# Patient Record
Sex: Female | Born: 1950 | Race: White | Hispanic: No | Marital: Married | Smoking: Never smoker
Health system: Southern US, Community
[De-identification: ages and names within clinical notes are randomized; demographics above are authoritative.]

## PROBLEM LIST (undated history)

## (undated) DIAGNOSIS — K589 Irritable bowel syndrome without diarrhea: Secondary | ICD-10-CM

## (undated) DIAGNOSIS — M722 Plantar fascial fibromatosis: Secondary | ICD-10-CM

## (undated) DIAGNOSIS — C801 Malignant (primary) neoplasm, unspecified: Secondary | ICD-10-CM

## (undated) DIAGNOSIS — R7303 Prediabetes: Secondary | ICD-10-CM

## (undated) DIAGNOSIS — Z8742 Personal history of other diseases of the female genital tract: Secondary | ICD-10-CM

## (undated) HISTORY — PX: NASAL SINUS SURGERY: SHX719

## (undated) HISTORY — PX: TOTAL ABDOMINAL HYSTERECTOMY W/ BILATERAL SALPINGOOPHORECTOMY: SHX83

## (undated) HISTORY — PX: BASAL CELL CARCINOMA EXCISION: SHX1214

## (undated) HISTORY — PX: RHINOPLASTY: SUR1284

## (undated) HISTORY — PX: APPENDECTOMY: SHX54

---

## 2018-05-02 DIAGNOSIS — H01119 Allergic dermatitis of unspecified eye, unspecified eyelid: Secondary | ICD-10-CM | POA: Insufficient documentation

## 2018-05-02 DIAGNOSIS — M7710 Lateral epicondylitis, unspecified elbow: Secondary | ICD-10-CM | POA: Insufficient documentation

## 2018-05-02 DIAGNOSIS — K2970 Gastritis, unspecified, without bleeding: Secondary | ICD-10-CM | POA: Insufficient documentation

## 2018-05-02 DIAGNOSIS — K581 Irritable bowel syndrome with constipation: Secondary | ICD-10-CM | POA: Insufficient documentation

## 2018-05-02 DIAGNOSIS — Z8249 Family history of ischemic heart disease and other diseases of the circulatory system: Secondary | ICD-10-CM | POA: Insufficient documentation

## 2018-05-02 DIAGNOSIS — M7550 Bursitis of unspecified shoulder: Secondary | ICD-10-CM | POA: Insufficient documentation

## 2018-07-27 DIAGNOSIS — Z13220 Encounter for screening for lipoid disorders: Secondary | ICD-10-CM | POA: Insufficient documentation

## 2019-02-06 ENCOUNTER — Ambulatory Visit (INDEPENDENT_AMBULATORY_CARE_PROVIDER_SITE_OTHER): Payer: Medicare Other | Admitting: Podiatry

## 2019-02-06 ENCOUNTER — Other Ambulatory Visit: Payer: Self-pay

## 2019-02-06 ENCOUNTER — Ambulatory Visit (INDEPENDENT_AMBULATORY_CARE_PROVIDER_SITE_OTHER): Payer: Medicare Other

## 2019-02-06 ENCOUNTER — Other Ambulatory Visit: Payer: Self-pay | Admitting: Podiatry

## 2019-02-06 DIAGNOSIS — M7731 Calcaneal spur, right foot: Secondary | ICD-10-CM | POA: Diagnosis not present

## 2019-02-06 DIAGNOSIS — M79671 Pain in right foot: Secondary | ICD-10-CM

## 2019-02-06 DIAGNOSIS — M216X9 Other acquired deformities of unspecified foot: Secondary | ICD-10-CM | POA: Diagnosis not present

## 2019-02-06 DIAGNOSIS — M722 Plantar fascial fibromatosis: Secondary | ICD-10-CM

## 2019-02-06 NOTE — Patient Instructions (Signed)

## 2019-02-06 NOTE — Progress Notes (Signed)
  Subjective:  Patient ID: Jessica Franco, female    DOB: November 25, 1950,  MRN: [id number]  Chief Complaint  Patient presents with  . Foot Pain    Rt bottom hele pain radiates to bak of the heel x June; 6/10 occasional throbbing -pt denies injjury/swellign -pt states feels swollen -wrose in AM Tx: aleve and elecaation    68 y.o. female presents with the above complaint. History above confirmed with patient.   Review of Systems: Negative except as noted in the HPI. Denies N/V/F/Ch.  No past medical history on file.  Current Outpatient Medications:  .  dicyclomine (BENTYL) 20 MG tablet, , Disp: , Rfl:   Social History   Tobacco Use  Smoking Status Not on file    Not on File Objective:  There were no vitals filed for this visit. There is no height or weight on file to calculate BMI. Constitutional Well developed. Well nourished.  Vascular Dorsalis pedis pulses palpable bilaterally. Posterior tibial pulses palpable bilaterally. Capillary refill normal to all digits.  No cyanosis or clubbing noted. Pedal hair growth normal.  Neurologic Normal speech. Oriented to person, place, and time. Epicritic sensation to light touch grossly present bilaterally.  Dermatologic Nails well groomed and normal in appearance. No open wounds. No skin lesions.  Orthopedic: Normal joint ROM without pain or crepitus bilaterally. No visible deformities. Tender to palpation at the calcaneal tuber right. No pain with calcaneal squeeze right. Ankle ROM diminished range of motion right. Silfverskiold Test: positive right.   Radiographs: Taken and reviewed. No acute fractures or dislocations. No evidence of stress fracture.  Plantar heel spur absent. Posterior heel spur present.   Assessment:   1. Plantar fasciitis   2. Equinus deformity of foot   3. Calcaneal spur of right foot    Plan:  Patient was evaluated and treated and all questions answered.  Plantar Fasciitis, right - XR reviewed as  above.  - Educated on icing and stretching. Instructions given.  - Injection delivered to the plantar fascia as below. - DME: Night splint dispensed, PF brace dispensed  Procedure: Injection Tendon/Ligament Location: Right plantar fascia at the glabrous junction; medial approach. Skin Prep: alcohol Injectate: 1 cc 0.5% marcaine plain, 1 cc dexamethasone phosphate, 0.5 cc kenalog 10. Disposition: Patient tolerated procedure well. Injection site dressed with a band-aid.  No follow-ups on file.

## 2019-02-15 ENCOUNTER — Other Ambulatory Visit: Payer: Self-pay | Admitting: Podiatry

## 2019-02-15 DIAGNOSIS — M722 Plantar fascial fibromatosis: Secondary | ICD-10-CM

## 2019-02-27 ENCOUNTER — Ambulatory Visit (INDEPENDENT_AMBULATORY_CARE_PROVIDER_SITE_OTHER): Payer: Medicare Other | Admitting: Podiatry

## 2019-02-27 ENCOUNTER — Other Ambulatory Visit: Payer: Self-pay

## 2019-02-27 DIAGNOSIS — B351 Tinea unguium: Secondary | ICD-10-CM

## 2019-02-27 DIAGNOSIS — M722 Plantar fascial fibromatosis: Secondary | ICD-10-CM

## 2019-02-27 DIAGNOSIS — S9030XA Contusion of unspecified foot, initial encounter: Secondary | ICD-10-CM | POA: Diagnosis not present

## 2019-02-27 DIAGNOSIS — M216X9 Other acquired deformities of unspecified foot: Secondary | ICD-10-CM | POA: Diagnosis not present

## 2019-02-27 DIAGNOSIS — M7731 Calcaneal spur, right foot: Secondary | ICD-10-CM

## 2019-02-27 MED ORDER — CICLOPIROX 8 % EX SOLN: Freq: Every day | CUTANEOUS | 0 refills | Status: DC

## 2019-02-27 NOTE — Progress Notes (Signed)
  Subjective:  Patient ID: Jessica Franco, female    DOB: [birth year],  MRN: [id number]  Chief Complaint  Patient presents with  . Plantar Fasciitis    F/U Rt PF Pt. states," much better, very little pain at my heel ; 5/10 occasional pains." -w/ swellgin Tx: NS, PF brace, icing, stretching, eelvation and aleve -pt states she thinks she's been applying her PF brace too tight it's been causing a little soreness and swellgin     69 y.o. female presents with the above complaint. History confirmed with patient. Also complains of discolored nails for which she would like treatment for. Also states she dropped something on her left foot a year ago weighing 60 lbs and has a bruise that she would like looked at.  Objective:  Physical Exam: warm, good capillary refill, nail exam onychomycosis of the toenails, no trophic changes or ulcerative lesions, normal DP and PT pulses, and normal sensory exam. Left Foot: chronic bruise 2nd MPJ  Right Foot: point tenderness over the heel pad   No images are attached to the encounter.  Radiographs: X-ray of the right foot: not indicated   Assessment:  1. Plantar fasciitis   2. Onychomycosis   3. Equinus deformity of foot   4. Calcaneal spur of right foot   5. Contusion of dorsum of foot      Plan:  Patient was evaluated and treated and all questions answered.  Plantar Fasciitis and Onychomycosis, Chronic contusion left -Injection delivered to the plantar fascia of the right foot. -Rx Penlac for Onychomycosis, educated on use. -Discussed chronic contusion, no treatment at this time, asymptomatic  Procedure: Injection Tendon/Ligament Consent: Verbal consent obtained. Location: Right plantar fascia at the glabrous junction; medial approach. Skin Prep: Alcohol. Injectate: 1 cc 0.5% marcaine plain, 1 cc dexamethasone phosphate, 0.5 cc kenalog 10. Disposition: Patient tolerated procedure well. Injection site dressed with a band-aid.   Return in about  3 months (around 05/28/2019), or if symptoms worsen or fail to improve, for Nail Fungus.   MDM Number of Diagnoses or Management Options Calcaneal spur of right foot: minor Contusion of dorsum of foot: new, no workup Equinus deformity of foot: established, improving Onychomycosis: new, needed workup Plantar fasciitis: established, improving Risk of Complications, Morbidity, and/or Mortality Presenting problems: low Diagnostic procedures: minimal Management options: low  Patient Progress Patient progress: improved

## 2019-05-29 ENCOUNTER — Other Ambulatory Visit: Payer: Self-pay

## 2019-05-29 ENCOUNTER — Ambulatory Visit (INDEPENDENT_AMBULATORY_CARE_PROVIDER_SITE_OTHER): Payer: Medicare Other | Admitting: Podiatry

## 2019-05-29 DIAGNOSIS — B351 Tinea unguium: Secondary | ICD-10-CM | POA: Diagnosis not present

## 2019-05-29 DIAGNOSIS — M792 Neuralgia and neuritis, unspecified: Secondary | ICD-10-CM

## 2019-05-29 NOTE — Progress Notes (Signed)
  Subjective:  Patient ID: Jessica Franco, female    DOB: 05/24/1950,  MRN: [id number]  Chief Complaint  Patient presents with  . Nail Problem    F/U nail fungus Pt. states," Improving, looking much better." Tx: penlac -pt dneis reaction to medication   . Plantar Fasciitis    F/U Rt PF pt. states," pain still comes and goes, but it's a lot better."     69 y.o. female presents with the above complaint. History confirmed with patient.   Has numb kind of pain in the right arch.   Objective:  Physical Exam: warm, good capillary refill, nail exam onychomycosis of the toenails, no trophic changes or ulcerative lesions, normal DP and PT pulses, and normal sensory exam. Right Foot: point tenderness over the heel pad   Assessment:   1. Neuritis   2. Onychomycosis    Plan:  Patient was evaluated and treated and all questions answered.  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 Injectate: 0.5 cc 0.5% marcaine plain, 0.5 cc dexamethasone phosphate. Disposition: Patient tolerated procedure well. Injection site dressed with a band-aid.  Onychomycosis -Improving. -Continue Penlac

## 2019-10-04 ENCOUNTER — Encounter (HOSPITAL_COMMUNITY): Payer: Self-pay | Admitting: Orthopedic Surgery

## 2019-10-04 DIAGNOSIS — S82041A Displaced comminuted fracture of right patella, initial encounter for closed fracture: Secondary | ICD-10-CM

## 2019-10-04 NOTE — Progress Notes (Deleted)
COVID Vaccine Completed: Yes Date COVID Vaccine completed: 04/03/19, 05/01/19 COVID vaccine manufacturer:     Moderna    PCP - Dr. Teressa Lower Cardiologist -   Chest x-ray -  EKG -  Stress Test -  ECHO -  Cardiac Cath -   Sleep Study -  CPAP -   Fasting Blood Sugar -  Checks Blood Sugar _____ times a day  Blood Thinner Instructions: Aspirin Instructions: Last Dose:  Anesthesia review:   Patient denies shortness of breath, fever, cough and chest pain at PAT appointment   Patient verbalized understanding of instructions that were given to them at the PAT appointment. Patient was also instructed that they will need to review over the PAT instructions again at home before surgery.

## 2019-10-04 NOTE — H&P (Signed)
Jessica Franco is an 69 y.o. female.   Chief Complaint: Right knee pain HPI: 69 yo healthy patient who tripped and landed on her flexed right knee. She complained of pain and difficulty weight bearing.  She had XRAYs done which show a displaced patella fracture. She denies other complaints. Patient presents now for operative fixation.   Past Medical History:  Diagnosis Date  . History of endometriosis   . Irritable bowel syndrome (IBS)   . Plantar fasciitis   . Pre-diabetes     No family history on file. Social History:  has no history on file for tobacco use, alcohol use, and drug use.  Allergies:  Allergies  Allergen Reactions  . Cyclosporine Other (See Comments)    Causes multiple eye infections   Biotin Calcium  Vitamin D Tramadol    Review of Systems : denies any other musculoskeletal pain   Physical Exam AAO, NAD Bilateral UEs with pain free AROM Right knee swollen and tender with decreased ROM due to pain Skin intact, NVI Left leg with pain free AROM   XRAYS: AP and Lateral xrays of the right knee show a displaced right patellar fracture  Assessment/Plan Right knee displaced patella fracture Plan ORIF using tension band technique Patient and husband agree with the plan  [name], MD 10/04/2019, 6:54 PM

## 2019-10-05 ENCOUNTER — Other Ambulatory Visit: Payer: Self-pay

## 2019-10-05 ENCOUNTER — Other Ambulatory Visit (HOSPITAL_COMMUNITY)
Admission: RE | Admit: 2019-10-05 | Discharge: 2019-10-05 | Disposition: A | Discharge status: Home / Self Care | Payer: Medicare Other | Source: Ambulatory Visit | Attending: Orthopedic Surgery | Admitting: Orthopedic Surgery

## 2019-10-05 ENCOUNTER — Encounter (HOSPITAL_COMMUNITY): Payer: Self-pay | Admitting: Orthopedic Surgery

## 2019-10-05 DIAGNOSIS — Z20822 Contact with and (suspected) exposure to covid-19: Secondary | ICD-10-CM | POA: Diagnosis not present

## 2019-10-05 DIAGNOSIS — Z01812 Encounter for preprocedural laboratory examination: Secondary | ICD-10-CM | POA: Insufficient documentation

## 2019-10-05 LAB — SARS CORONAVIRUS 2 (TAT 6-24 HRS): SARS Coronavirus 2: NEGATIVE

## 2019-10-06 ENCOUNTER — Encounter (HOSPITAL_COMMUNITY): Payer: Self-pay | Admitting: Orthopedic Surgery

## 2019-10-06 ENCOUNTER — Other Ambulatory Visit: Payer: Self-pay

## 2019-10-06 ENCOUNTER — Encounter (HOSPITAL_COMMUNITY)
Admission: RE | Disposition: A | Discharge status: Home / Self Care | Payer: Self-pay | Source: Home / Self Care | Attending: Orthopedic Surgery

## 2019-10-06 ENCOUNTER — Ambulatory Visit (HOSPITAL_COMMUNITY): Payer: Medicare Other

## 2019-10-06 ENCOUNTER — Ambulatory Visit (HOSPITAL_COMMUNITY)
Admission: RE | Admit: 2019-10-06 | Discharge: 2019-10-06 | Disposition: A | Discharge status: Home / Self Care | Payer: Medicare Other | Attending: Orthopedic Surgery | Admitting: Orthopedic Surgery

## 2019-10-06 ENCOUNTER — Ambulatory Visit (HOSPITAL_COMMUNITY): Payer: Medicare Other | Admitting: Certified Registered Nurse Anesthetist

## 2019-10-06 DIAGNOSIS — Z7982 Long term (current) use of aspirin: Secondary | ICD-10-CM | POA: Insufficient documentation

## 2019-10-06 DIAGNOSIS — R7303 Prediabetes: Secondary | ICD-10-CM | POA: Insufficient documentation

## 2019-10-06 DIAGNOSIS — K589 Irritable bowel syndrome without diarrhea: Secondary | ICD-10-CM | POA: Diagnosis not present

## 2019-10-06 DIAGNOSIS — W010XXA Fall on same level from slipping, tripping and stumbling without subsequent striking against object, initial encounter: Secondary | ICD-10-CM | POA: Insufficient documentation

## 2019-10-06 DIAGNOSIS — Z6830 Body mass index (BMI) 30.0-30.9, adult: Secondary | ICD-10-CM | POA: Insufficient documentation

## 2019-10-06 DIAGNOSIS — Z791 Long term (current) use of non-steroidal anti-inflammatories (NSAID): Secondary | ICD-10-CM | POA: Insufficient documentation

## 2019-10-06 DIAGNOSIS — M722 Plantar fascial fibromatosis: Secondary | ICD-10-CM | POA: Insufficient documentation

## 2019-10-06 DIAGNOSIS — Z79899 Other long term (current) drug therapy: Secondary | ICD-10-CM | POA: Insufficient documentation

## 2019-10-06 DIAGNOSIS — M1990 Unspecified osteoarthritis, unspecified site: Secondary | ICD-10-CM | POA: Insufficient documentation

## 2019-10-06 DIAGNOSIS — Z419 Encounter for procedure for purposes other than remedying health state, unspecified: Secondary | ICD-10-CM

## 2019-10-06 DIAGNOSIS — E669 Obesity, unspecified: Secondary | ICD-10-CM | POA: Diagnosis not present

## 2019-10-06 DIAGNOSIS — S82041A Displaced comminuted fracture of right patella, initial encounter for closed fracture: Secondary | ICD-10-CM

## 2019-10-06 DIAGNOSIS — S82001A Unspecified fracture of right patella, initial encounter for closed fracture: Secondary | ICD-10-CM | POA: Diagnosis present

## 2019-10-06 HISTORY — DX: Plantar fascial fibromatosis: M72.2

## 2019-10-06 HISTORY — PX: ORIF PATELLA: SHX5033

## 2019-10-06 HISTORY — DX: Personal history of other diseases of the female genital tract: Z87.42

## 2019-10-06 HISTORY — DX: Prediabetes: R73.03

## 2019-10-06 HISTORY — DX: Irritable bowel syndrome without diarrhea: K58.9

## 2019-10-06 HISTORY — DX: Malignant (primary) neoplasm, unspecified: C80.1

## 2019-10-06 LAB — CBC
HCT: 43 % (ref 36.0–46.0)
Hemoglobin: 13.8 g/dL (ref 12.0–15.0)
MCH: 28.2 pg (ref 26.0–34.0)
MCHC: 32.1 g/dL (ref 30.0–36.0)
MCV: 87.8 fL (ref 80.0–100.0)
Platelets: 269 10*3/uL (ref 150–400)
RBC: 4.9 MIL/uL (ref 3.87–5.11)
RDW: 14.6 % (ref 11.5–15.5)
WBC: 8 10*3/uL (ref 4.0–10.5)
nRBC: 0 % (ref 0.0–0.2)

## 2019-10-06 LAB — HEMOGLOBIN A1C
Hgb A1c MFr Bld: 5.8 % — ABNORMAL HIGH (ref 4.8–5.6)
Mean Plasma Glucose: 119.76 mg/dL

## 2019-10-06 SURGERY — OPEN REDUCTION INTERNAL FIXATION (ORIF) PATELLA
Anesthesia: General | Site: Knee | Laterality: Right

## 2019-10-06 MED ORDER — MIDAZOLAM HCL 5 MG/5ML IJ SOLN
INTRAMUSCULAR | Status: DC | PRN
  Administered 2019-10-06 (×2): 1 mg via INTRAVENOUS

## 2019-10-06 MED ORDER — ORAL CARE MOUTH RINSE: 15.0000 mL | Freq: Once | OROMUCOSAL | Status: AC

## 2019-10-06 MED ORDER — PROPOFOL 10 MG/ML IV BOLUS
INTRAVENOUS | Status: AC
  Filled 2019-10-06: qty 20

## 2019-10-06 MED ORDER — FENTANYL CITRATE (PF) 100 MCG/2ML IJ SOLN
50.0000 ug | INTRAMUSCULAR | Status: DC
  Administered 2019-10-06: 100 ug via INTRAVENOUS
  Filled 2019-10-06: qty 2

## 2019-10-06 MED ORDER — PROPOFOL 10 MG/ML IV BOLUS
INTRAVENOUS | Status: DC | PRN
  Administered 2019-10-06: 150 mg via INTRAVENOUS

## 2019-10-06 MED ORDER — PHENYLEPHRINE 40 MCG/ML (10ML) SYRINGE FOR IV PUSH (FOR BLOOD PRESSURE SUPPORT)
PREFILLED_SYRINGE | INTRAVENOUS | Status: AC
  Filled 2019-10-06: qty 10

## 2019-10-06 MED ORDER — ONDANSETRON HCL 4 MG/2ML IJ SOLN: 4.0000 mg | Freq: Once | INTRAMUSCULAR | Status: DC | PRN

## 2019-10-06 MED ORDER — FENTANYL CITRATE (PF) 100 MCG/2ML IJ SOLN
INTRAMUSCULAR | Status: AC
  Filled 2019-10-06: qty 2

## 2019-10-06 MED ORDER — DEXAMETHASONE SODIUM PHOSPHATE 10 MG/ML IJ SOLN
INTRAMUSCULAR | Status: DC | PRN
  Administered 2019-10-06: 10 mg via INTRAVENOUS

## 2019-10-06 MED ORDER — DEXAMETHASONE SODIUM PHOSPHATE 10 MG/ML IJ SOLN
INTRAMUSCULAR | Status: AC
  Filled 2019-10-06: qty 1

## 2019-10-06 MED ORDER — MIDAZOLAM HCL 2 MG/2ML IJ SOLN
INTRAMUSCULAR | Status: AC
  Filled 2019-10-06: qty 2

## 2019-10-06 MED ORDER — MIDAZOLAM HCL 2 MG/2ML IJ SOLN
1.0000 mg | INTRAMUSCULAR | Status: DC
  Administered 2019-10-06: 1 mg via INTRAVENOUS
  Filled 2019-10-06: qty 2

## 2019-10-06 MED ORDER — LIDOCAINE 2% (20 MG/ML) 5 ML SYRINGE
INTRAMUSCULAR | Status: DC | PRN
  Administered 2019-10-06: 100 mg via INTRAVENOUS

## 2019-10-06 MED ORDER — LACTATED RINGERS IV SOLN: INTRAVENOUS | Status: DC

## 2019-10-06 MED ORDER — ONDANSETRON HCL 4 MG PO TABS: 4.0000 mg | ORAL_TABLET | Freq: Three times a day (TID) | ORAL | 1 refills | Status: AC | PRN

## 2019-10-06 MED ORDER — LIDOCAINE 2% (20 MG/ML) 5 ML SYRINGE
INTRAMUSCULAR | Status: AC
  Filled 2019-10-06: qty 5

## 2019-10-06 MED ORDER — FENTANYL CITRATE (PF) 100 MCG/2ML IJ SOLN
INTRAMUSCULAR | Status: AC
  Administered 2019-10-06: 50 ug via INTRAVENOUS
  Filled 2019-10-06: qty 2

## 2019-10-06 MED ORDER — SODIUM CHLORIDE 0.9 % IR SOLN
Status: DC | PRN
  Administered 2019-10-06: 1

## 2019-10-06 MED ORDER — HYDROCODONE-ACETAMINOPHEN 7.5-325 MG PO TABS: 1.0000 | ORAL_TABLET | Freq: Once | ORAL | Status: DC | PRN

## 2019-10-06 MED ORDER — EPHEDRINE SULFATE-NACL 50-0.9 MG/10ML-% IV SOSY
PREFILLED_SYRINGE | INTRAVENOUS | Status: DC | PRN
  Administered 2019-10-06: 10 mg via INTRAVENOUS

## 2019-10-06 MED ORDER — CHLORHEXIDINE GLUCONATE 0.12 % MT SOLN
15.0000 mL | Freq: Once | OROMUCOSAL | Status: AC
  Administered 2019-10-06: 15 mL via OROMUCOSAL

## 2019-10-06 MED ORDER — ONDANSETRON HCL 4 MG/2ML IJ SOLN
INTRAMUSCULAR | Status: DC | PRN
  Administered 2019-10-06: 4 mg via INTRAVENOUS

## 2019-10-06 MED ORDER — ONDANSETRON HCL 4 MG/2ML IJ SOLN
INTRAMUSCULAR | Status: AC
  Filled 2019-10-06: qty 2

## 2019-10-06 MED ORDER — FENTANYL CITRATE (PF) 100 MCG/2ML IJ SOLN
25.0000 ug | INTRAMUSCULAR | Status: DC | PRN
  Administered 2019-10-06: 50 ug via INTRAVENOUS

## 2019-10-06 MED ORDER — EPHEDRINE 5 MG/ML INJ
INTRAVENOUS | Status: AC
  Filled 2019-10-06: qty 10

## 2019-10-06 MED ORDER — HYDROCODONE-ACETAMINOPHEN 5-325 MG PO TABS: 1.0000 | ORAL_TABLET | ORAL | 0 refills | Status: AC | PRN

## 2019-10-06 MED ORDER — METHOCARBAMOL 500 MG PO TABS: 500.0000 mg | ORAL_TABLET | Freq: Four times a day (QID) | ORAL | 1 refills | Status: AC | PRN

## 2019-10-06 MED ORDER — DEXAMETHASONE SODIUM PHOSPHATE 10 MG/ML IJ SOLN
INTRAMUSCULAR | Status: DC | PRN
  Administered 2019-10-06: 10 mg

## 2019-10-06 MED ORDER — ROPIVACAINE HCL 5 MG/ML IJ SOLN
INTRAMUSCULAR | Status: DC | PRN
Start: 2019-10-06 — End: 2019-10-06
  Administered 2019-10-06: 30 mL via PERINEURAL

## 2019-10-06 MED ORDER — CEFAZOLIN SODIUM-DEXTROSE 2-4 GM/100ML-% IV SOLN
2.0000 g | INTRAVENOUS | Status: AC
  Administered 2019-10-06: 2 g via INTRAVENOUS
  Filled 2019-10-06: qty 100

## 2019-10-06 MED ORDER — ROPIVACAINE HCL 5 MG/ML IJ SOLN: INTRAMUSCULAR | Status: DC | PRN

## 2019-10-06 MED ORDER — FENTANYL CITRATE (PF) 100 MCG/2ML IJ SOLN
INTRAMUSCULAR | Status: DC | PRN
  Administered 2019-10-06 (×2): 50 ug via INTRAVENOUS

## 2019-10-06 SURGICAL SUPPLY — 39 items
18 gauze surgical steel wire ×3 IMPLANT
BAG ZIPLOCK 12X15 (MISCELLANEOUS) ×3 IMPLANT
BNDG ELASTIC 6X5.8 VLCR STR LF (GAUZE/BANDAGES/DRESSINGS) ×3 IMPLANT
BNDG GAUZE ELAST 4 BULKY (GAUZE/BANDAGES/DRESSINGS) ×3 IMPLANT
COVER SURGICAL LIGHT HANDLE (MISCELLANEOUS) ×3 IMPLANT
COVER WAND RF STERILE (DRAPES) IMPLANT
CUFF TOURN SGL QUICK 34 (TOURNIQUET CUFF) ×2
CUFF TRNQT CYL 34X4.125X (TOURNIQUET CUFF) ×1 IMPLANT
DRAPE C-ARM 42X120 X-RAY (DRAPES) ×3 IMPLANT
DRAPE U-SHAPE 47X51 STRL (DRAPES) ×3 IMPLANT
DRSG ADAPTIC 3X8 NADH LF (GAUZE/BANDAGES/DRESSINGS) ×3 IMPLANT
DRSG PAD ABDOMINAL 8X10 ST (GAUZE/BANDAGES/DRESSINGS) ×3 IMPLANT
DURAPREP 26ML APPLICATOR (WOUND CARE) ×3 IMPLANT
ELECT REM PT RETURN 15FT ADLT (MISCELLANEOUS) ×3 IMPLANT
GAUZE SPONGE 4X4 12PLY STRL (GAUZE/BANDAGES/DRESSINGS) ×3 IMPLANT
GLOVE ORTHO TXT STRL SZ7.5 (GLOVE) ×3 IMPLANT
GOWN STRL REUS W/TWL LRG LVL3 (GOWN DISPOSABLE) ×6 IMPLANT
GUIDEPIN DHS/DCS (PIN) ×6 IMPLANT
GUIDEWIRE THREADED 150MM (WIRE) ×3 IMPLANT
IMMOBILIZER KNEE 20 (SOFTGOODS)
IMMOBILIZER KNEE 20 THIGH 36 (SOFTGOODS) IMPLANT
KIT TURNOVER KIT A (KITS) IMPLANT
MANIFOLD NEPTUNE II (INSTRUMENTS) ×3 IMPLANT
PACK ORTHO EXTREMITY (CUSTOM PROCEDURE TRAY) ×3 IMPLANT
PADDING CAST SYN 6 (CAST SUPPLIES) ×4
PADDING CAST SYNTHETIC 6X4 NS (CAST SUPPLIES) ×2 IMPLANT
PENCIL SMOKE EVACUATOR (MISCELLANEOUS) IMPLANT
PROTECTOR NERVE ULNAR (MISCELLANEOUS) ×3 IMPLANT
SCREW CANN S THRD/44 4.0 (Screw) ×3 IMPLANT
SCREW SHORT THREAD 4.0X34 (Screw) ×3 IMPLANT
STAPLER VISISTAT 35W (STAPLE) ×3 IMPLANT
SUT FIBERWIRE #2 38 T-5 BLUE (SUTURE) ×6
SUT STEEL 7 (SUTURE) ×3 IMPLANT
SUT VIC AB 1 CT1 27 (SUTURE) ×8
SUT VIC AB 1 CT1 27XBRD ANTBC (SUTURE) ×4 IMPLANT
SUT VIC AB 2-0 CT1 27 (SUTURE) ×4
SUT VIC AB 2-0 CT1 TAPERPNT 27 (SUTURE) ×2 IMPLANT
SUTURE FIBERWR #2 38 T-5 BLUE (SUTURE) ×2 IMPLANT
TOWEL OR 17X26 10 PK STRL BLUE (TOWEL DISPOSABLE) ×6 IMPLANT

## 2019-10-06 NOTE — Brief Op Note (Signed)
10/06/2019  4:32 PM  PATIENT:  Jessica Franco  69 y.o. female  PRE-OPERATIVE DIAGNOSIS:  Right knee displaced patella fracture  POST-OPERATIVE DIAGNOSIS:  Right knee displaced patella fracture  PROCEDURE:  Procedure(s): OPEN REDUCTION INTERNAL (ORIF) FIXATION PATELLA (Right) 4.0 SS Cannulated screws and 18 ga SS wire tension band  SURGEON:  Surgeon(s) and Role:    Netta Cedars, MD - Primary  PHYSICIAN ASSISTANT:   ASSISTANTS: Molli Barrows PA-C   ANESTHESIA:   regional and general  EBL:  25 mL   BLOOD ADMINISTERED:none  DRAINS: none   LOCAL MEDICATIONS USED:  NONE    SPECIMEN:  No Specimen  DISPOSITION OF SPECIMEN:  N/A  COUNTS:  YES  TOURNIQUET:   Total Tourniquet Time Documented: Thigh (Right) - 48 minutes Total: Thigh (Right) - 48 minutes   DICTATION: .Other Dictation: Dictation Number [id number]  PLAN OF CARE: Discharge to home after PACU  PATIENT DISPOSITION:  PACU - hemodynamically stable.   Delay start of Pharmacological VTE agent (>24hrs) due to surgical blood loss or risk of bleeding: no

## 2019-10-06 NOTE — Transfer of Care (Signed)
Immediate Anesthesia Transfer of Care Note  Patient: Jessica Franco  Procedure(s) Performed: OPEN REDUCTION INTERNAL (ORIF) FIXATION PATELLA (Right Knee)  Patient Location: PACU  Anesthesia Type:General  Level of Consciousness: awake, alert  and oriented  Airway & Oxygen Therapy: Patient Spontanous Breathing and Patient connected to face mask oxygen  Post-op Assessment: Report given to RN and Post -op Vital signs reviewed and stable  Post vital signs: Reviewed and stable  Last Vitals:  Vitals Value Taken Time  BP 133/71 10/06/19 1638  Temp    Pulse 88 10/06/19 1639  Resp 14 10/06/19 1639  SpO2 100 % 10/06/19 1639  Vitals shown include unvalidated device data.  Last Pain:  Vitals:   10/06/19 1331  TempSrc: Oral         Complications: No complications documented.

## 2019-10-06 NOTE — Interval H&P Note (Signed)
History and Physical Interval Note:  10/06/2019 2:42 PM  Jessica Franco  has presented today for surgery, with the diagnosis of Right knee patella fracture.  The various methods of treatment have been discussed with the patient and family. After consideration of risks, benefits and other options for treatment, the patient has consented to  Procedure(s): OPEN REDUCTION INTERNAL (ORIF) FIXATION PATELLA (Right) as a surgical intervention.  The patient's history has been reviewed, patient examined, no change in status, stable for surgery.  I have reviewed the patient's chart and labs.  Questions were answered to the patient's satisfaction.     [name]

## 2019-10-06 NOTE — Progress Notes (Signed)
Assisted Dr. Foster with right, ultrasound guided, femoral block. Side rails up, monitors on throughout procedure. See vital signs in flow sheet. Tolerated Procedure well. °

## 2019-10-06 NOTE — Anesthesia Postprocedure Evaluation (Signed)
Anesthesia Post Note  Patient: Adriona Kaney  Procedure(s) Performed: OPEN REDUCTION INTERNAL (ORIF) FIXATION PATELLA (Right Knee)     Patient location during evaluation: PACU Anesthesia Type: General Level of consciousness: awake and alert, oriented and awake Pain management: pain level controlled Vital Signs Assessment: post-procedure vital signs reviewed and stable Respiratory status: spontaneous breathing, nonlabored ventilation, respiratory function stable and patient connected to nasal cannula oxygen Cardiovascular status: blood pressure returned to baseline and stable Postop Assessment: no apparent nausea or vomiting Anesthetic complications: no   No complications documented.  Last Vitals:  Vitals:   10/06/19 1730 10/06/19 1745  BP: 131/69 (!) 152/78  Pulse: 76 83  Resp: 11 16  Temp:    SpO2: 97% 97%    Last Pain:  Vitals:   10/06/19 1745  TempSrc:   PainSc: 0-No pain                 Catalina Gravel

## 2019-10-06 NOTE — Anesthesia Procedure Notes (Signed)
Anesthesia Regional Block: Femoral nerve block   Pre-Anesthetic Checklist: ,, timeout performed, Correct Patient, Correct Site, Correct Laterality, Correct Procedure, Correct Position, site marked, Risks and benefits discussed,  Surgical consent,  Pre-op evaluation,  At surgeon's request and post-op pain management  Laterality: Right  Prep: chloraprep       Needles:  Injection technique: Single-shot  Needle Type: Echogenic Stimulator Needle     Needle Length: 9cm  Needle Gauge: 21   Needle insertion depth: 6 cm   Additional Needles:   Procedures:,,,, ultrasound used (permanent image in chart),,,,  Narrative:  Start time: 10/06/2019 2:13 PM End time: 10/06/2019 2:18 PM Injection made incrementally with aspirations every 5 mL.  Performed by: Personally  Anesthesiologist: Josephine Igo, MD  Additional Notes: Timeout performed. Patient sedated. Relevant anatomy ID'd using [place]. Incremental 2-45ml injection of LA with frequent aspiration. Patient tolerated procedure well.        Right Femoral Nerve Block

## 2019-10-06 NOTE — Anesthesia Preprocedure Evaluation (Addendum)
Anesthesia Evaluation  Patient identified by MRN, date of birth, ID band Patient awake    Reviewed: Allergy & Precautions, NPO status , Patient's Chart, lab work & pertinent test results  Airway Mallampati: II  TM Distance: >3 FB Neck ROM: Full    Dental no notable dental hx. (+) Teeth Intact, Caps   Pulmonary neg pulmonary ROS,    Pulmonary exam normal breath sounds clear to auscultation       Cardiovascular negative cardio ROS Normal cardiovascular exam Rhythm:Regular Rate:Normal     Neuro/Psych negative neurological ROS  negative psych ROS   GI/Hepatic Neg liver ROS, IBS   Endo/Other  Obesity  Renal/GU negative Renal ROS  negative genitourinary   Musculoskeletal  (+) Arthritis , Right patella Fx   Abdominal (+) + obese,   Peds  Hematology negative hematology ROS (+)   Anesthesia Other Findings   Reproductive/Obstetrics Hx/o endometriosis                            Anesthesia Physical Anesthesia Plan  ASA: II  Anesthesia Plan: General   Post-op Pain Management:  Regional for Post-op pain   Induction: Intravenous  PONV Risk Score and Plan: 4 or greater and Treatment may vary due to age or medical condition and Ondansetron  Airway Management Planned: LMA  Additional Equipment:   Intra-op Plan:   Post-operative Plan: Extubation in OR  Informed Consent: I have reviewed the patients History and Physical, chart, labs and discussed the procedure including the risks, benefits and alternatives for the proposed anesthesia with the patient or authorized representative who has indicated his/her understanding and acceptance.     Dental advisory given  Plan Discussed with: CRNA and Anesthesiologist  Anesthesia Plan Comments:         Anesthesia Quick Evaluation

## 2019-10-06 NOTE — Discharge Instructions (Signed)
Ice to the knee constantly.  Keep the incision covered and clean and dry for one week, then ok to get it wet in the shower.  Do not move your knee, keep it straight and wear the brace when up walking.   May loosen the brace to ice the knee when resting.   Use the walker while you are up place minimal weight on the right leg.  Wear your support stockings 24/7 to prevent blood clots and take baby aspirin twice daily for 30 days also to prevent blood clots  Follow up with Dr Veverly Fells in two weeks in the office, call [phone] for appt

## 2019-10-06 NOTE — Anesthesia Procedure Notes (Signed)
Procedure Name: LMA Insertion Date/Time: 10/06/2019 3:30 PM Performed by: Silas [place], CRNA Pre-anesthesia Checklist: Patient identified, Emergency Drugs available, Suction available and Patient being monitored Patient Re-evaluated:Patient Re-evaluated prior to induction Oxygen Delivery Method: Circle system utilized Preoxygenation: Pre-oxygenation with 100% oxygen Induction Type: IV induction LMA: LMA flexible inserted LMA Size: 4.0 Tube type: Oral Number of attempts: 1 Airway Equipment and Method: Stylet and Oral airway Placement Confirmation: positive ETCO2 and breath sounds checked- equal and bilateral Tube secured with: Tape Dental Injury: Teeth and Oropharynx as per pre-operative assessment

## 2019-10-07 NOTE — Op Note (Signed)
NAMELOGANN, WHITEBREAD MEDICAL RECORD [id number] ACCOUNT [id number] DATE OF BIRTH:September 02, 1950 FACILITY: [facility] LOCATION: [facility] PHYSICIAN:STEVEN Orlena Sheldon, MD  OPERATIVE REPORT  DATE OF PROCEDURE:  10/06/2019  PREOPERATIVE DIAGNOSIS:  Displaced right patellar fracture.  POSTOPERATIVE DIAGNOSIS:  Displaced right patellar fracture.  PROCEDURE PERFORMED:  Open reduction internal fixation of right patellar fracture utilizing 4.0 stainless steel cannulated screws by [employer] and 18-gauge stainless steel wire tension band.  ATTENDING SURGEON:  Esmond Plants, MD   ASSISTANT:  Narda Amber, PA-C, who was scrubbed during the entire procedure and necessary for satisfactory completion of surgery.   ANESTHESIA:  General anesthesia was used plus an adductor canal block.  ESTIMATED BLOOD LOSS:  Minimal.  FLUID REPLACEMENT:  1200 mL crystalloid.  INSTRUMENT COUNTS:  Correct.  COMPLICATIONS:  No complications.  ANTIBIOTICS:  Perioperative antibiotics were given.  INDICATIONS:  The patient is a 69 year old female who suffered a fall in a flexed knee, injuring her right patella.  The patient presented with a displaced patellar fracture to orthopedics with an extensor lag and we recommended surgical management to  restore her extensor mechanism.  Informed consent obtained.  DESCRIPTION OF PROCEDURE:  After an adequate level of anesthesia was achieved, the patient was positioned supine on the operating room table.  A nonsterile tourniquet was placed on right proximal thigh.  Right leg sterilely prepped and draped in the  usual manner.  Time-out called, verifying correct patient, correct site, we elevated the limb, exsanguinated using Esmarch bandage.  We then elevated the tourniquet to 300 mmHg.  We performed a midline incision with a 10 blade scalpel.  Dissection down  through subcutaneous tissues sharply to the periosteum of the patella, which was intact.  I could feel the gap between the  fracture site, the periosteum was intact.  Thus, we utilized C-arm was brought in for AP and lateral views and then reduced the  fracture anatomically using a tenaculum clamp.  Once we had the fracture site reduced and I did do a small vertical incision in the lateral retinaculum, so I could put my finger on the backside of the patella and make sure that the cartilage was  perfectly aligned, which it was.  We fired 2 guide pins for the 4.0 cannulated screws from distal to proximal getting good purchase and with good alignment.  Once we were happy with those guide pins, We measured the lengths of those screws, which were 44  mm and 34 mm in length.  We placed first a 44 partially threaded 4.0 cannulated screws, stainless steel screw a little bit more medially and then the more lateral screw was the 34 mm 4.0 cannulated screw.  We had good compression across the fracture  site.  We then went ahead and threaded 18-gauge stainless steel wire through those screws and crossed that in a tension band technique anteriorly.  We used our wire twisters to tension the tension band down to the anterior patella.  This gave an  excellent anatomic reduction, good compression across the anterior aspect of the patella.  We cut the wires and twisted the wires down, turning the sharp ends into the soft tissue.  We were pleased with our tension band technique and our fracture was  anatomically aligned with good screw placement.  At this point, we irrigated, obtained final x-rays and then went ahead and repaired the parapatellar incision with a 0 Vicryl figure-of-eight 2 and then 2-0 Vicryl subcutaneous closure and staples for  skin.  Sterile compressive bandage applied followed  by knee immobilizer.  The patient transported to recovery room in stable condition.  PN/NUANCE  D:10/06/2019 T:10/07/2019 JOB:[id number]

## 2019-10-10 ENCOUNTER — Encounter (HOSPITAL_COMMUNITY): Payer: Self-pay | Admitting: Orthopedic Surgery

## 2021-01-18 IMAGING — RF DG C-ARM 1-60 MIN-NO REPORT
1 series · 3 of 3 positions shown · non-contrast
Comparison: None.

CLINICAL DATA: Right patella ORIF

EXAM:
RIGHT KNEE - 1-2 VIEW; DG C-ARM 1-60 MIN-NO REPORT

[Series 1: unknown protocol · 0.20mm/px · 3 of 3 slices shown]
[im 1/3]
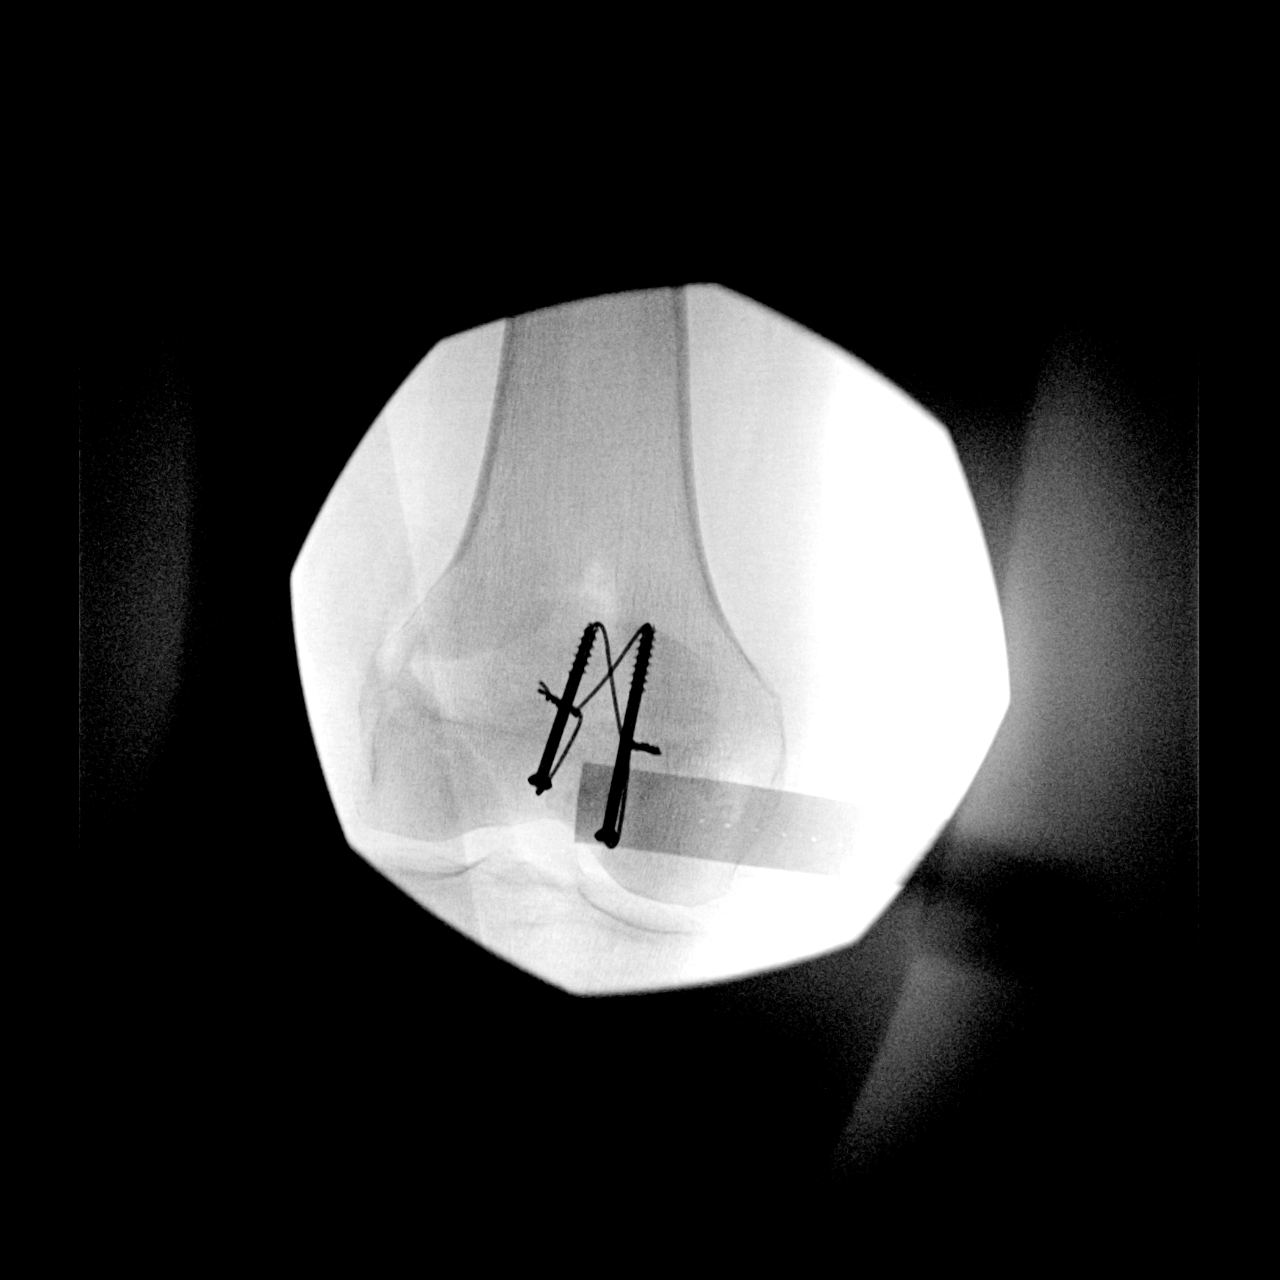
[im 2/3]
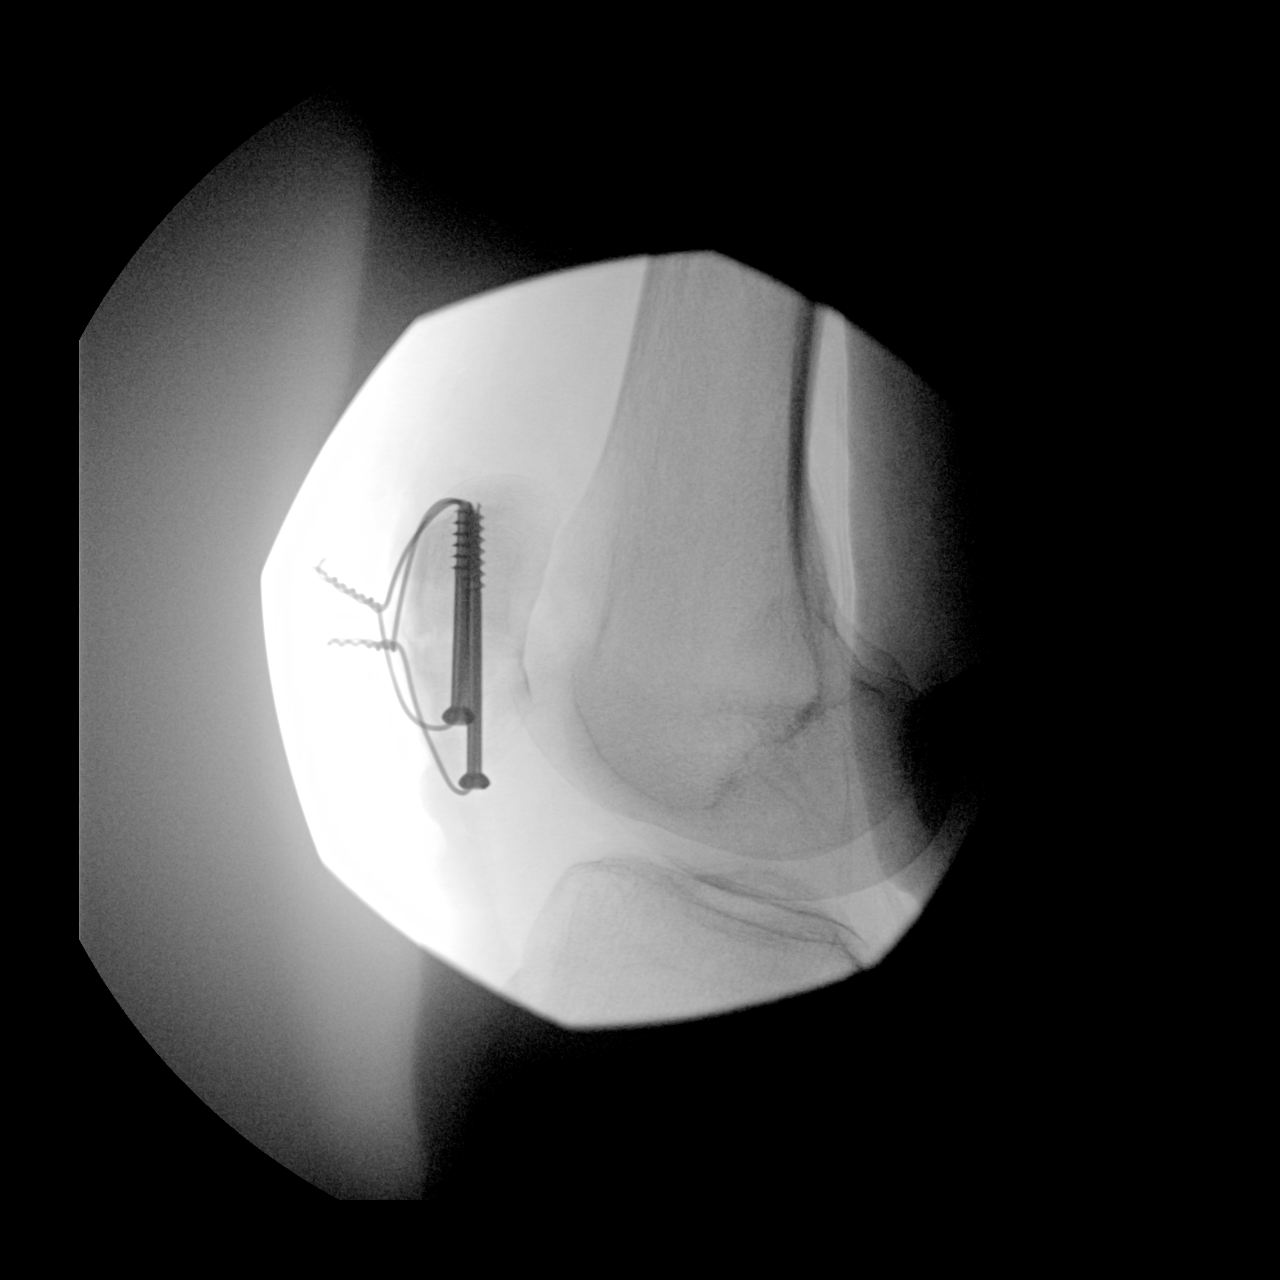
[im 3/3]
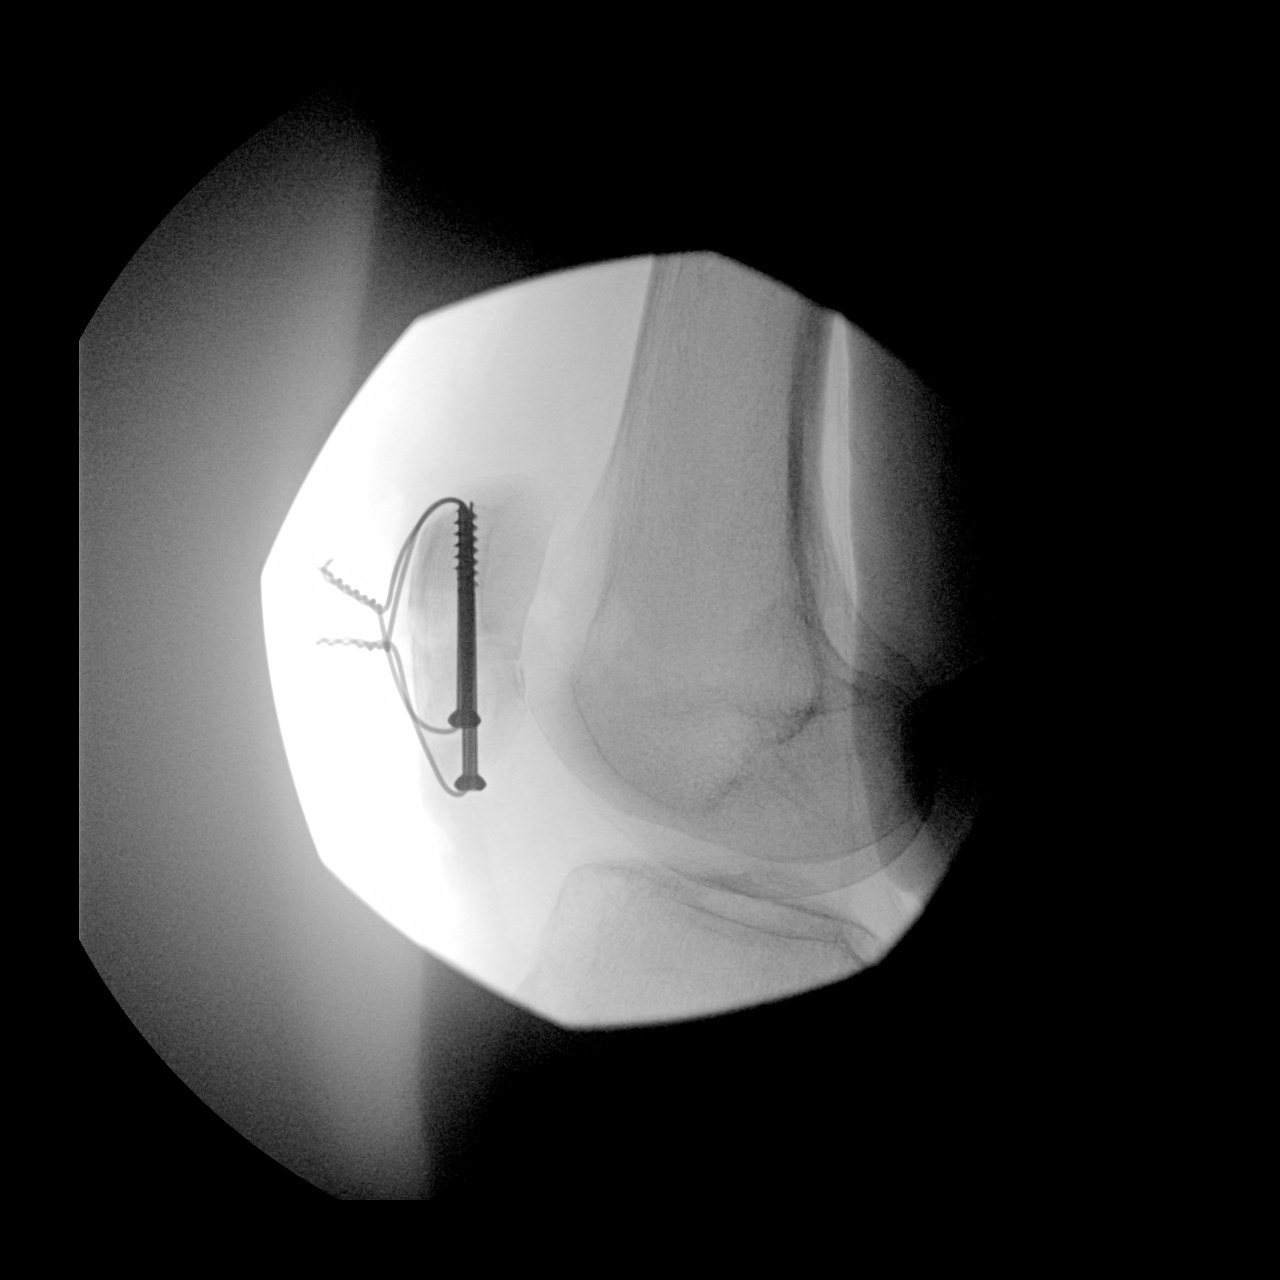

[3 of 3 positions shown; findings below may reference images not displayed]

FINDINGS: 3 C-arm fluoroscopic images were obtained intraoperatively and
submitted for post operative interpretation. Postsurgical changes
from ORIF of the patella with 2 partially threaded screws and
tension wires. A transverse fracture of the mid pole of the patella
is evident. Patellar alignment is near anatomic. 23 seconds of
fluoroscopy time was utilized. Please see the performing provider's
procedural report for further detail.
IMPRESSION: As above.
